# Patient Record
Sex: Male | Born: 1970 | Race: Black or African American | Hispanic: No | Marital: Single
Health system: Southern US, Community
[De-identification: ages and names within clinical notes are randomized; demographics above are authoritative.]

---

## 2011-03-01 ENCOUNTER — Emergency Department (HOSPITAL_COMMUNITY): Payer: Medicaid - Out of State

## 2011-03-01 ENCOUNTER — Emergency Department (HOSPITAL_COMMUNITY)
Admission: EM | Admit: 2011-03-01 | Discharge: 2011-03-01 | Disposition: A | Discharge status: Home / Self Care | Payer: Medicaid - Out of State | Attending: Emergency Medicine | Admitting: Emergency Medicine

## 2011-03-01 DIAGNOSIS — M79609 Pain in unspecified limb: Secondary | ICD-10-CM | POA: Insufficient documentation

## 2011-03-01 DIAGNOSIS — M109 Gout, unspecified: Secondary | ICD-10-CM | POA: Insufficient documentation

## 2012-12-31 IMAGING — CR DG FOOT COMPLETE 3+V*L*
3 series · 3 of 3 positions shown · non-contrast
Comparison: None.

CLINICAL DATA: Pain plantar surface of foot.  No known injury

LEFT FOOT - COMPLETE 3+ VIEW

[t foot ap left]
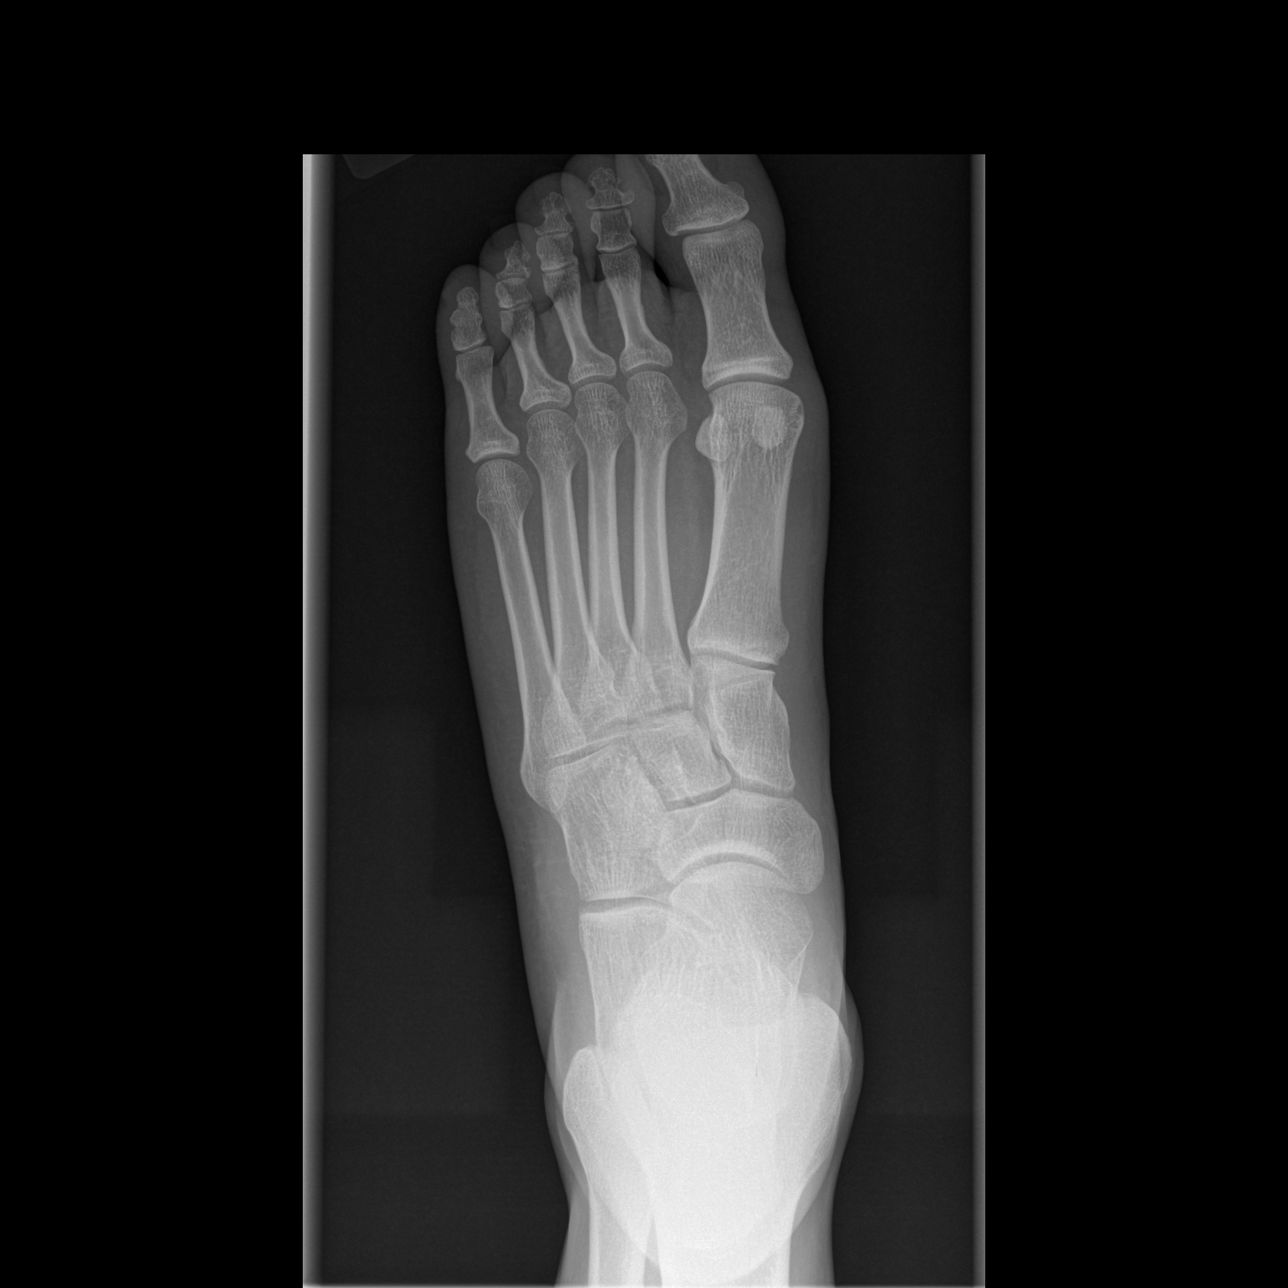

[t foot oblique left]
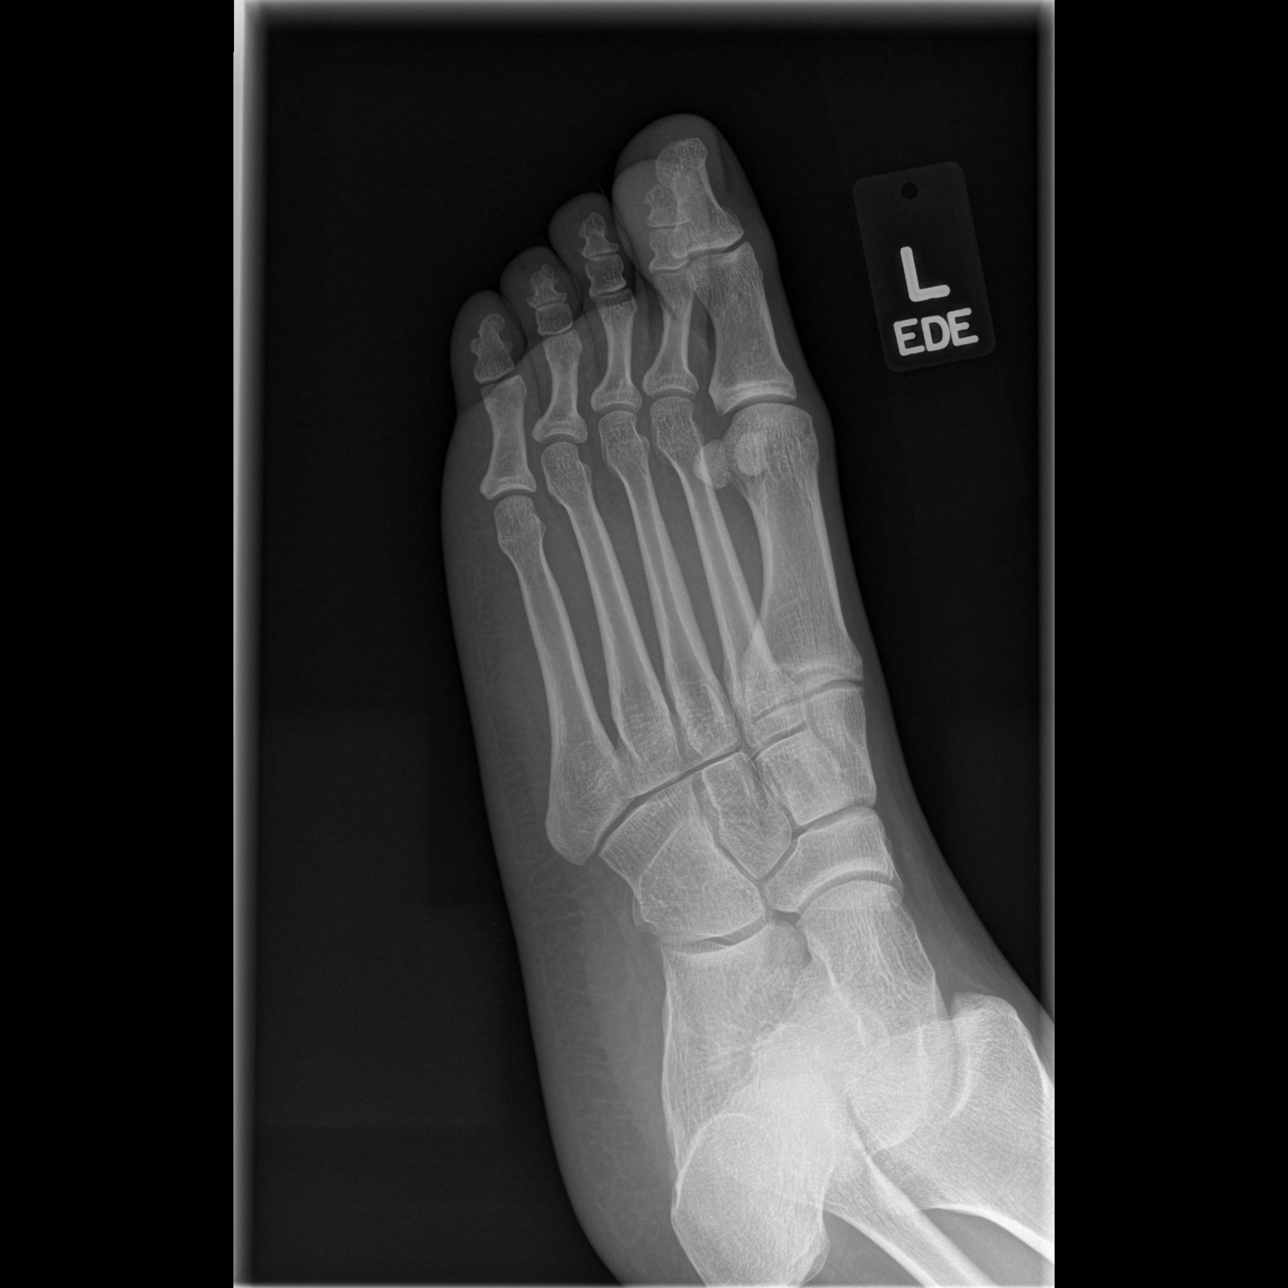

[t foot lat left]
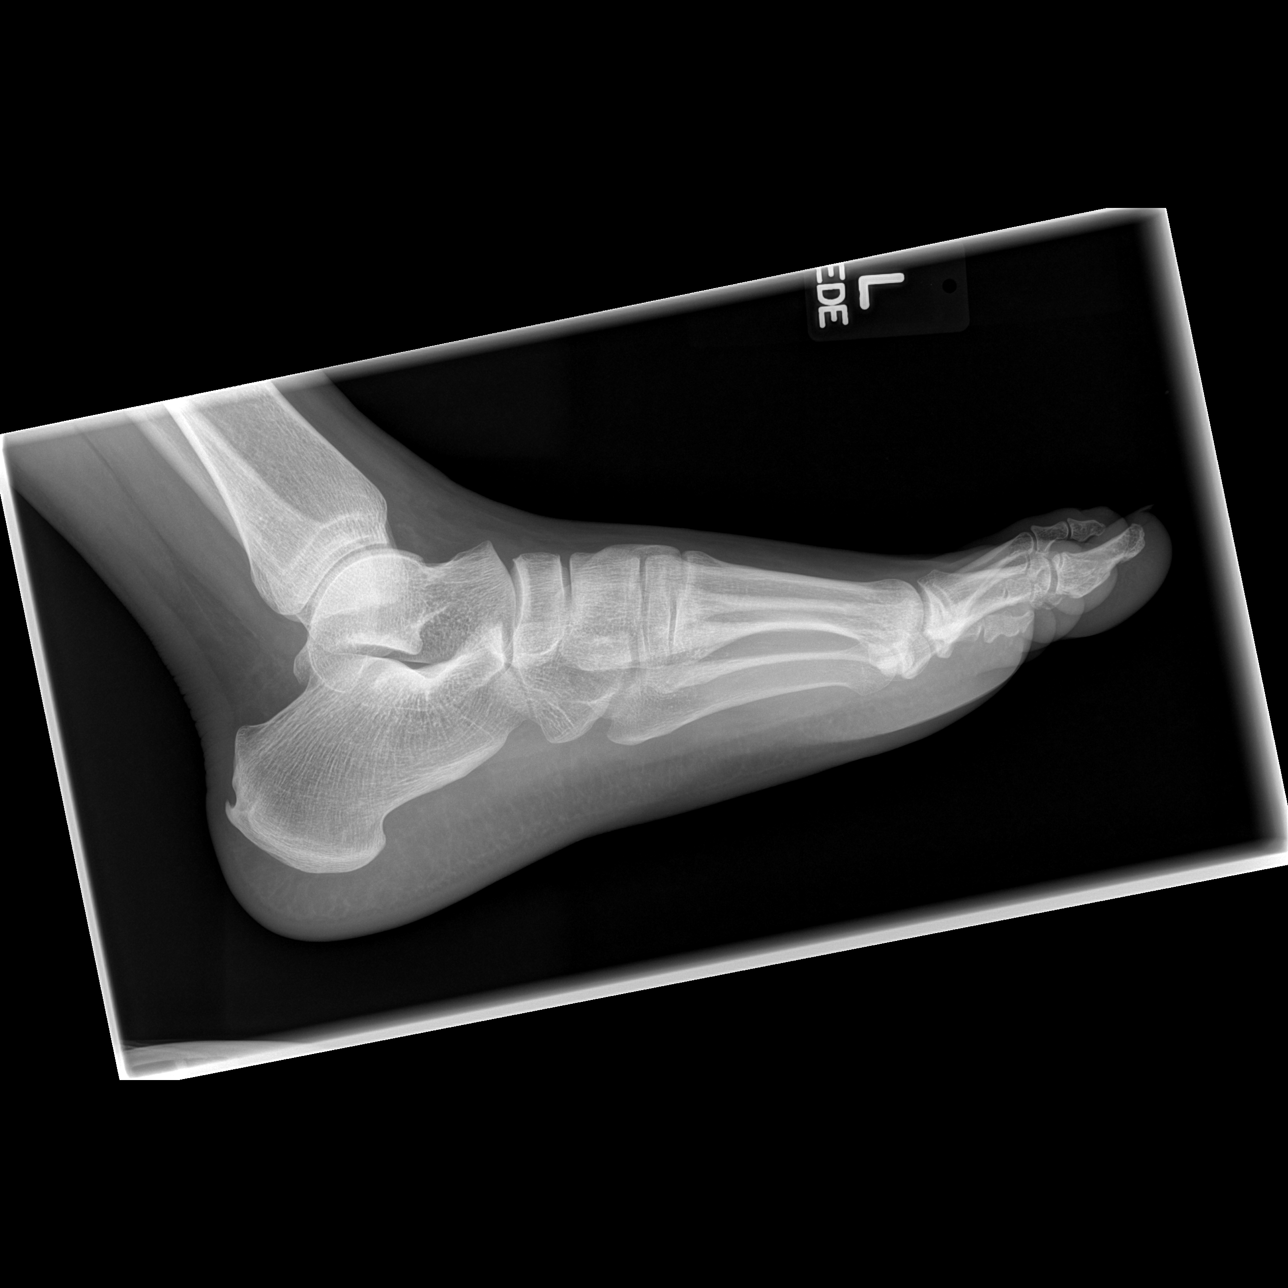

[3 of 3 positions shown; findings below may reference images not displayed]

FINDINGS: No fracture or dislocation.  No specific arthropathy. No
foreign body or other abnormality of the soft tissues.

There is a minimal spur at the Achilles tendon insertion on the
calcaneus.
IMPRESSION: No acute or significant findings.  There is a small calcaneal spur.

## 2015-12-21 DIAGNOSIS — Z5900 Homelessness unspecified: Secondary | ICD-10-CM

## 2015-12-21 NOTE — Congregational Nurse Program (Signed)
Congregational Nurse Program Note  Date of Encounter: 12/21/2015  Past Medical History: No past medical history on file.  Encounter Details:     CNP Questionnaire - 12/21/15 1408    Patient Demographics   Is this a new or existing patient? New   Patient is considered a/an Not Applicable   Race African-American/Black   Patient Assistance   Location of Patient Assistance Not Applicable   Patient's financial/insurance status Low Income;Self-Pay   Uninsured Patient Yes   Interventions Counseled to make appt. with provider   Patient referred to apply for the following financial assistance [employer]   Food insecurities addressed Provided food supplies   Transportation assistance No   Assistance securing medications No   Educational Programmer, systemshealth offerings Navigating the healthcare system;Cardiac disease;Hypertension   Encounter Details   Primary purpose of visit Chronic Illness/Condition Visit;Education/Health Concerns   Was an Emergency Department visit averted? Not Applicable   Does patient have a medical provider? No   Patient referred to Clinic   Was a mental health screening completed? (GAINS tool) No   Does patient have dental issues? No   Does patient have vision issues? No   Since previous encounter, have you referred patient for abnormal blood pressure that resulted in a new diagnosis or medication change? No   Since previous encounter, have you referred patient for abnormal blood glucose that resulted in a new diagnosis or medication change? No   For Abstraction Use Only   Does patient have insurance? No      B/P check

## 2015-12-26 DIAGNOSIS — Z5900 Homelessness unspecified: Secondary | ICD-10-CM

## 2016-01-09 DIAGNOSIS — Z5900 Homelessness unspecified: Secondary | ICD-10-CM

## 2016-01-11 NOTE — Congregational Nurse Program (Signed)
Congregational Nurse Program Note  Date of Encounter: 01/09/2016  Past Medical History: No past medical history on file.  Encounter Details:     CNP Questionnaire - 01/09/16 1501    Patient Demographics   Is this a new or existing patient? Existing   Patient is considered a/an Not Applicable   Race African-American/Black   Patient Assistance   Location of Patient Assistance Not Applicable   Patient's financial/insurance status Low Income;Self-Pay   Uninsured Patient Yes   Interventions Counseled to make appt. with provider   Patient referred to apply for the following financial assistance [employer]   Food insecurities addressed Provided food supplies   Transportation assistance No   Assistance securing medications No   Educational Programmer, systemshealth offerings Navigating the healthcare system;Cardiac disease;Hypertension   Encounter Details   Primary purpose of visit Chronic Illness/Condition Visit;Education/Health Concerns   Was an Emergency Department visit averted? Not Applicable   Does patient have a medical provider? No   Patient referred to Clinic   Was a mental health screening completed? (GAINS tool) No   Does patient have dental issues? No   Does patient have vision issues? No   Since previous encounter, have you referred patient for abnormal blood pressure that resulted in a new diagnosis or medication change? No   Since previous encounter, have you referred patient for abnormal blood glucose that resulted in a new diagnosis or medication change? No   For Abstraction Use Only   Does patient have insurance? No       B/P check.  C/O breathing difficulty secondary to allergies.  Breath sounds clear.  C/O eyes "itching" and congestion.  Suggested OTC allergy medications.

## 2016-01-11 NOTE — Congregational Nurse Program (Signed)
Congregational Nurse Program Note  Date of Encounter: 12/26/2015  Past Medical History: No past medical history on file.  Encounter Details:     CNP Questionnaire - 12/26/15 1459    Patient Demographics   Is this a new or existing patient? Existing   Patient is considered a/an Not Applicable   Race African-American/Black   Patient Assistance   Location of Patient Assistance Not Applicable   Patient's financial/insurance status Low Income;Self-Pay   Uninsured Patient Yes   Interventions Counseled to make appt. with provider   Patient referred to apply for the following financial assistance [employer]   Food insecurities addressed Provided food supplies   Transportation assistance No   Assistance securing medications No   Educational Programmer, systemshealth offerings Navigating the healthcare system;Cardiac disease;Hypertension   Encounter Details   Primary purpose of visit Chronic Illness/Condition Visit;Education/Health Concerns   Was an Emergency Department visit averted? Not Applicable   Does patient have a medical provider? No   Patient referred to Clinic   Was a mental health screening completed? (GAINS tool) No   Does patient have dental issues? No   Does patient have vision issues? No   Since previous encounter, have you referred patient for abnormal blood pressure that resulted in a new diagnosis or medication change? No   Since previous encounter, have you referred patient for abnormal blood glucose that resulted in a new diagnosis or medication change? No   For Abstraction Use Only   Does patient have insurance? No       B/P check.

## 2016-01-15 DIAGNOSIS — Z5900 Homelessness unspecified: Secondary | ICD-10-CM

## 2016-01-18 NOTE — Congregational Nurse Program (Signed)
Congregational Nurse Program Note  Date of Encounter: 01/15/2016  Past Medical History: No past medical history on file.  Encounter Details:     CNP Questionnaire - 01/16/16 1456    Patient Demographics   Is this a new or existing patient? Existing   Patient is considered a/an Not Applicable   Race African-American/Black   Patient Assistance   Location of Patient Assistance Not Applicable   Patient's financial/insurance status Low Income;Self-Pay   Uninsured Patient Yes   Interventions Counseled to make appt. with provider   Patient referred to apply for the following financial assistance [employer]   Food insecurities addressed Provided food supplies   Transportation assistance No   Assistance securing medications No   Educational Programmer, systemshealth offerings Navigating the healthcare system;Cardiac disease;Hypertension   Encounter Details   Primary purpose of visit Chronic Illness/Condition Visit;Education/Health Concerns   Was an Emergency Department visit averted? Not Applicable   Does patient have a medical provider? No   Patient referred to Clinic   Was a mental health screening completed? (GAINS tool) No   Does patient have dental issues? No   Does patient have vision issues? No   Since previous encounter, have you referred patient for abnormal blood pressure that resulted in a new diagnosis or medication change? No   Since previous encounter, have you referred patient for abnormal blood glucose that resulted in a new diagnosis or medication change? No   For Abstraction Use Only   Does patient have insurance? No       C/o of "ear ache".  Probably related to his sinus congestion and allergy symptoms.  Encouraged client to make appointment with L[facility] Ann [facility]  Bus passes were provided
